# Patient Record
Sex: Female | Born: 1990 | Race: White | Hispanic: No | Marital: Single | Smoking: Current every day smoker
Health system: Southern US, Community
[De-identification: ages and names within clinical notes are randomized; demographics above are authoritative.]

---
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
## 2010-03-01 IMAGING — CR DG ANKLE COMPLETE 3+V*R*
2 series · 2 of 2 positions shown · non-contrast
Comparison: None available

CLINICAL DATA: Twisting injury

RIGHT ANKLE - COMPLETE 3+ VIEW

[view not recorded (1 of 2)]
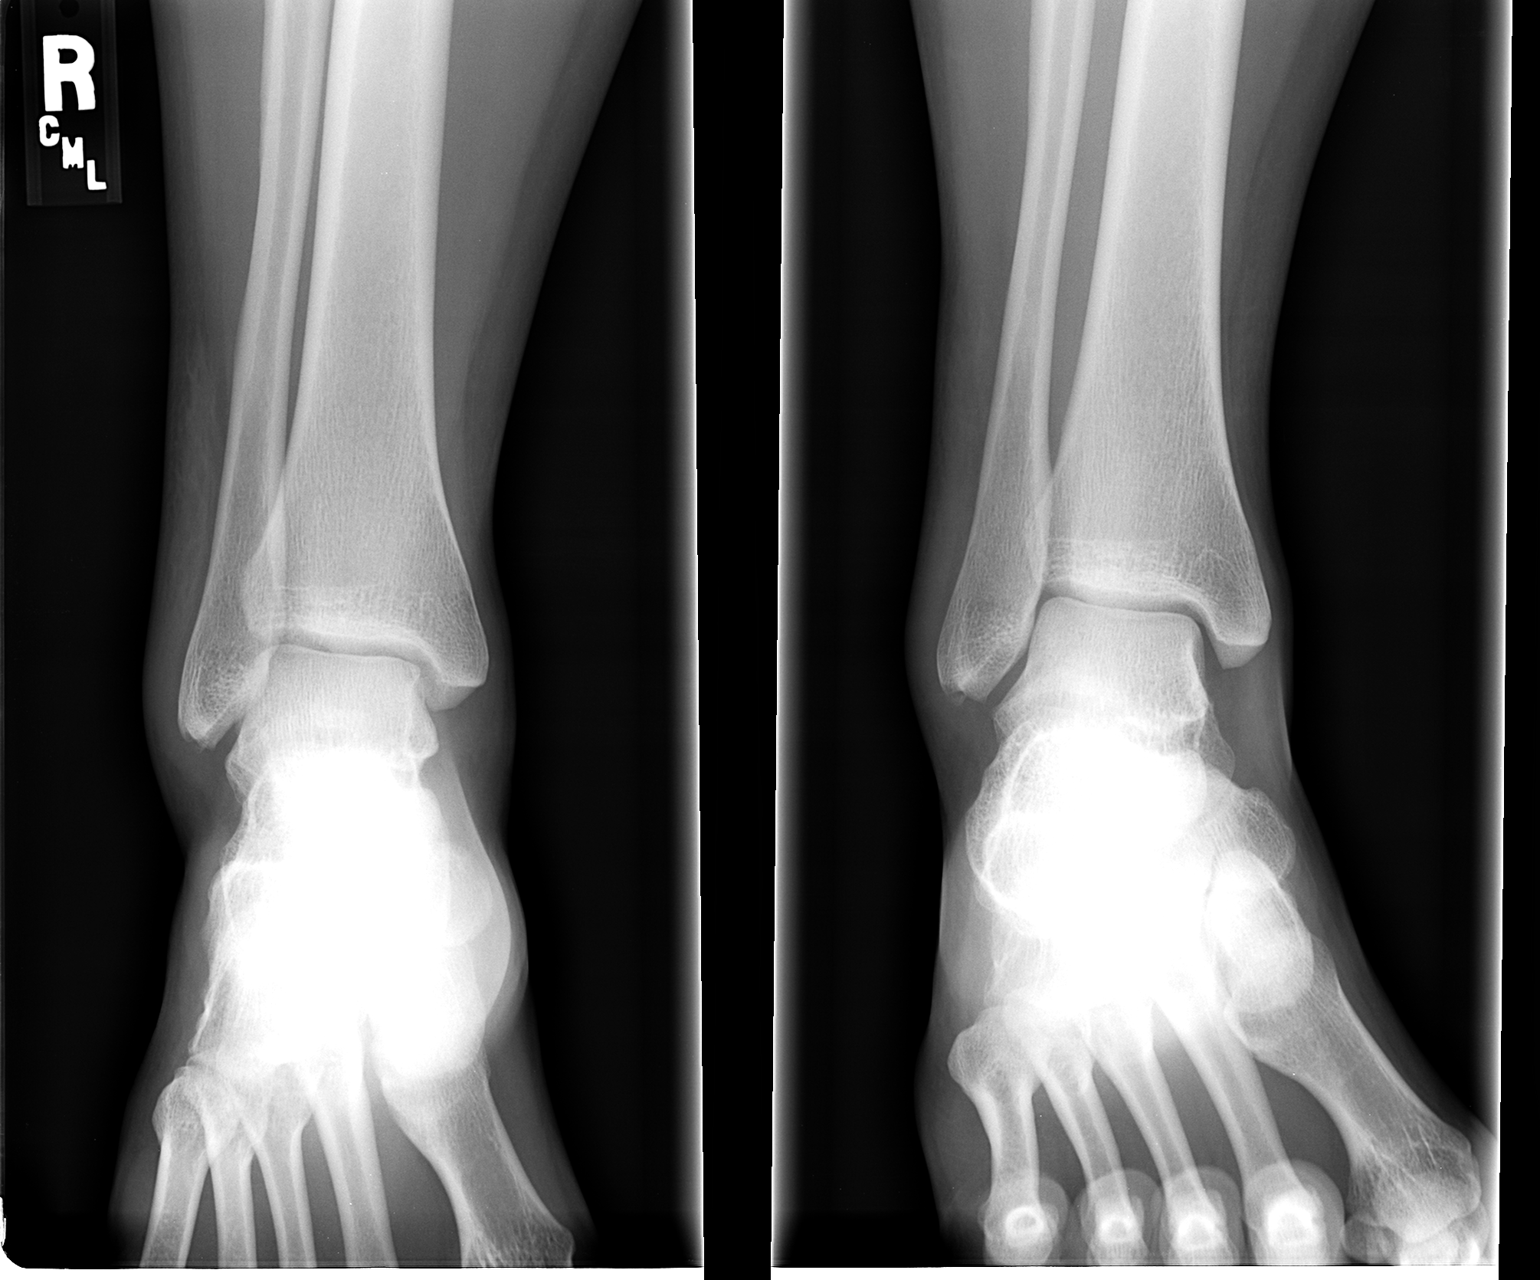

[view not recorded (2 of 2)]
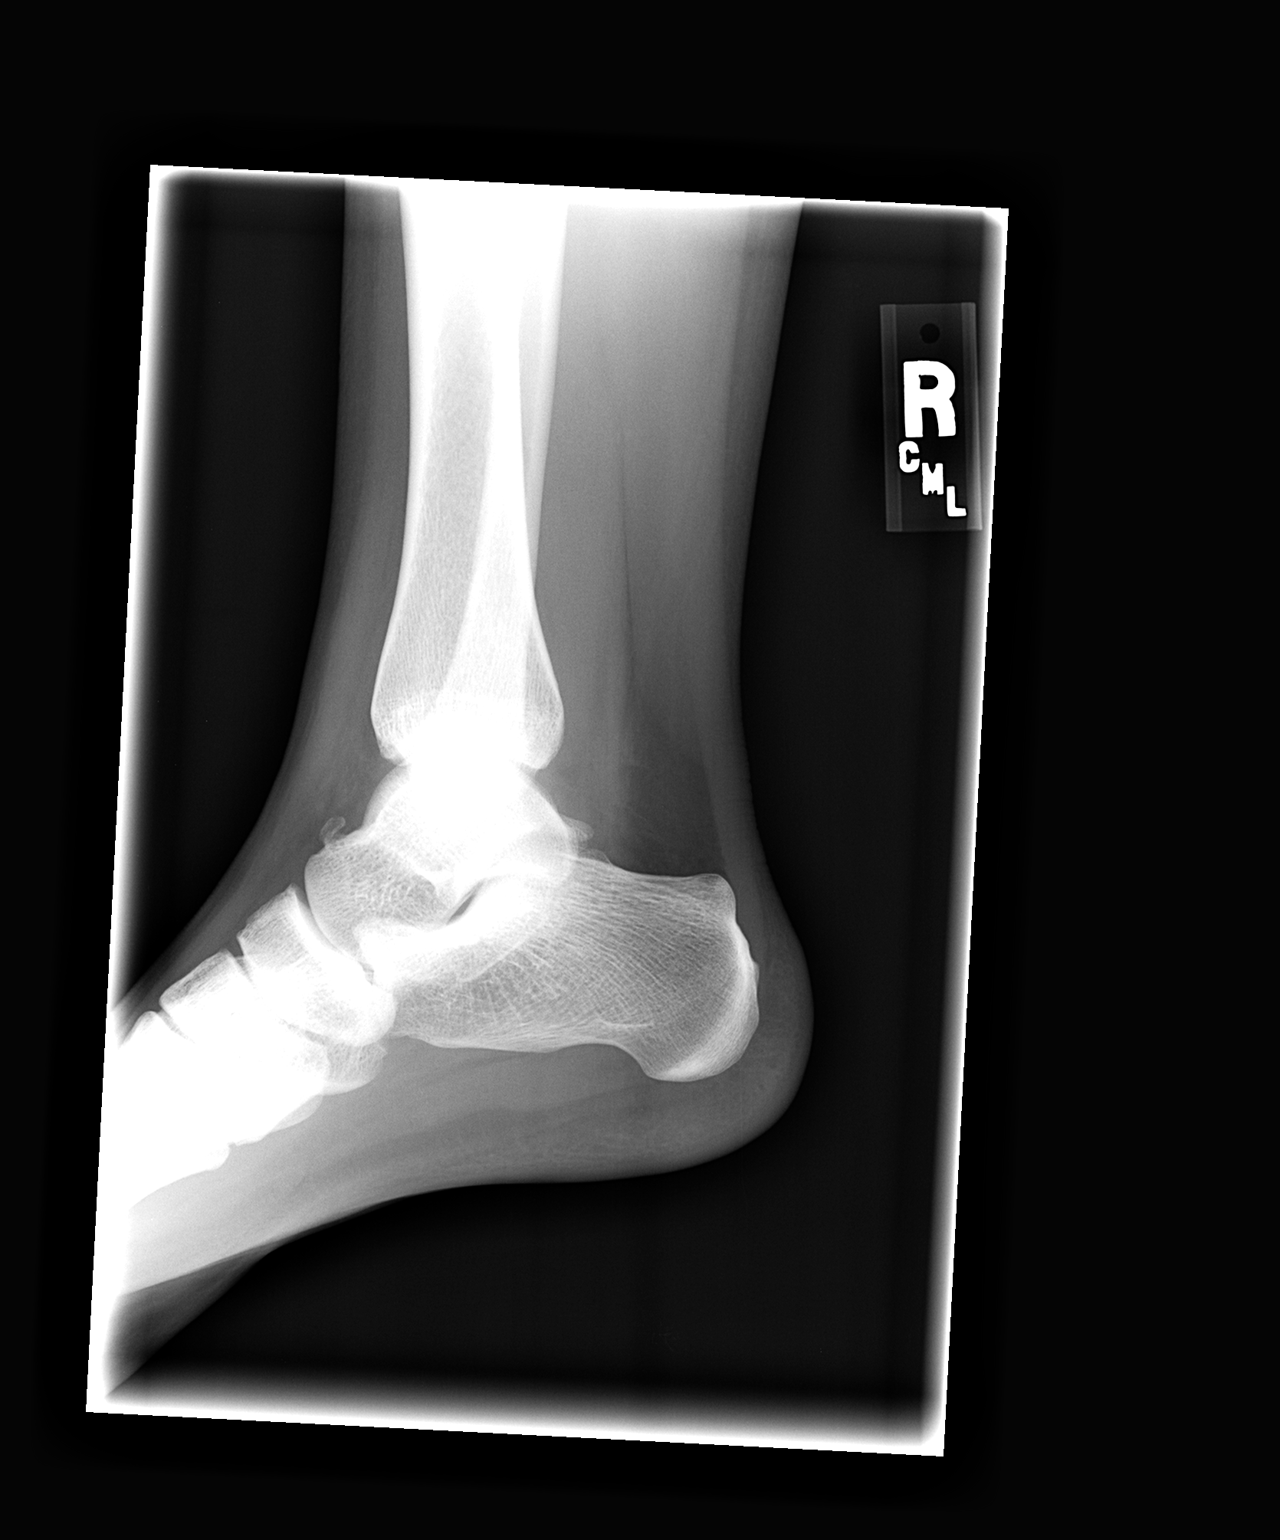

[2 of 2 positions shown; findings below may reference images not displayed]

FINDINGS: 3 mm avulsion fragment projects just inferior to the
lateral malleolus.  There is overlying soft tissue swelling.
Corticated ossicle is noted dorsal to the distal talus.  Ankle
mortise is intact.
IMPRESSION: 1.  Small avulsion fracture from the lateral malleolus.

## 2014-02-22 ENCOUNTER — Telehealth: Payer: Self-pay | Admitting: Cardiology

## 2014-02-22 NOTE — Telephone Encounter (Signed)
[facility] scheduled for appointment 10.30.15 with Dr Antoine PocheHochrein  lp

## 2014-03-16 ENCOUNTER — Ambulatory Visit: Payer: Self-pay | Admitting: Cardiology

## 2014-04-19 ENCOUNTER — Encounter: Payer: Self-pay | Admitting: Cardiology

## 2014-04-30 ENCOUNTER — Encounter: Payer: Self-pay | Admitting: Cardiology

## 2014-09-06 ENCOUNTER — Ambulatory Visit: Payer: Self-pay | Admitting: Women's Health

## 2016-06-22 ENCOUNTER — Emergency Department (HOSPITAL_COMMUNITY)
Admission: EM | Admit: 2016-06-22 | Discharge: 2016-06-22 | Disposition: A | Discharge status: Home / Self Care | Payer: Self-pay | Attending: Emergency Medicine | Admitting: Emergency Medicine

## 2016-06-22 ENCOUNTER — Encounter (HOSPITAL_COMMUNITY): Payer: Self-pay | Admitting: Emergency Medicine

## 2016-06-22 DIAGNOSIS — R05 Cough: Secondary | ICD-10-CM

## 2016-06-22 DIAGNOSIS — R69 Illness, unspecified: Secondary | ICD-10-CM

## 2016-06-22 DIAGNOSIS — Z720 Tobacco use: Secondary | ICD-10-CM

## 2016-06-22 DIAGNOSIS — F17210 Nicotine dependence, cigarettes, uncomplicated: Secondary | ICD-10-CM | POA: Insufficient documentation

## 2016-06-22 DIAGNOSIS — R059 Cough, unspecified: Secondary | ICD-10-CM

## 2016-06-22 DIAGNOSIS — J111 Influenza due to unidentified influenza virus with other respiratory manifestations: Secondary | ICD-10-CM | POA: Insufficient documentation

## 2016-06-22 MED ORDER — OSELTAMIVIR PHOSPHATE 75 MG PO CAPS: 75.0000 mg | ORAL_CAPSULE | Freq: Two times a day (BID) | ORAL | 0 refills | Status: AC

## 2016-06-22 NOTE — ED Triage Notes (Signed)
Pt complaint of flu like symptoms onset yesterday; associated chills, cough, generalizes aches. Pt verbalizes n/v/d on occasion but still able to tolerate fluids/foods.

## 2016-06-22 NOTE — ED Provider Notes (Signed)
[facility]-EMERGENCY DEPT Provider Note   CSN: [id number] Arrival date & time: 06/22/16  1535  By signing my name below, I, Doreatha MartinEva Mathews, attest that this documentation has been prepared under the direction and in the presence of  [address]Winefred Hillesheim, [employer]-C. Electronically Signed: Doreatha MartinEva Mathews, ED Scribe. 06/22/16. 6:24 PM.    History   Chief Complaint Chief Complaint  Patient presents with  . Flu Like Symptoms    HPI Kristin Becker is a 26 y.o. female otherwise healthy who presents to the Emergency Department complaining of flu-like symptoms and moderate, gradually worsening generalized myalgias that began yesterday at 3PM. Pt reports associated chills, HA, sore throat, productive cough with yellow phlegm, and rhinorrhea with clear drainage. Pt also reports occasional blood tinged sputum with coughing, but denies gross hemoptysis. She states she has taken Mucinex and Emergen-C with mild relief, and there are no worsening factors noted. Pt reports possible recent sick contact d/t visiting a hospital and a prison over the weekend. Pt is a current smoker. No h/o asthma or COPD to her knowledge. She denies hemoptysis, ear pain/drainage, drooling, trismus, difficulty swallowing, wheezing, fevers, CP, SOB, abd pain, N/V/D/C, hematuria, dysuria, myalgias, arthralgias, numbness, tingling, focal weakness, rashes, or any other additional complaints.   The history is provided by the patient and medical records. No language interpreter was used.  Influenza  Presenting symptoms: cough, headache, myalgias, rhinorrhea and sore throat   Presenting symptoms: no diarrhea, no fever, no nausea, no shortness of breath and no vomiting   Severity:  Moderate Onset quality:  Gradual Duration:  1 day Progression:  Worsening Chronicity:  New Relieved by:  OTC medications and decongestant (emergen-c and mucinex) Worsened by:  Nothing Ineffective treatments:  None tried Associated symptoms: chills   Associated symptoms: no ear  pain   Risk factors: sick contacts   Risk factors: no immunocompromised state     History reviewed. No pertinent past medical history.  There are no active problems to display for this patient.   History reviewed. No pertinent surgical history.  OB History    No data available       Home Medications    Prior to Admission medications   Not on File    Family History No family history on file.  Social History Social History  Substance Use Topics  . Smoking status: Current Every Day Smoker    Packs/day: 0.50    Types: Cigarettes  . Smokeless tobacco: Not on file  . Alcohol use No     Allergies   Sulfa antibiotics   Review of Systems Review of Systems  Constitutional: Positive for chills. Negative for fever.  HENT: Positive for rhinorrhea and sore throat. Negative for drooling, ear discharge, ear pain and trouble swallowing.   Respiratory: Positive for cough. Negative for shortness of breath and wheezing.        No hemoptysis  Cardiovascular: Negative for chest pain.  Gastrointestinal: Negative for abdominal pain, constipation, diarrhea, nausea and vomiting.  Genitourinary: Negative for dysuria and hematuria.  Musculoskeletal: Positive for myalgias. Negative for arthralgias.  Skin: Negative for color change and rash.  Allergic/Immunologic: Negative for immunocompromised state.  Neurological: Positive for headaches. Negative for weakness and numbness.  Psychiatric/Behavioral: Negative for confusion.    A complete 10 system review of systems was obtained and all systems are negative except as noted in the HPI and PMH.    Physical Exam Updated Vital Signs BP 127/95 (BP Location: Left Arm)   Pulse 91   Temp  98.9 F (37.2 C) (Oral)   Resp 16   Wt 131 lb (59.4 kg)   LMP 06/18/2016   SpO2 100%   Physical Exam  Constitutional: She is oriented to person, place, and time. Vital signs are normal. She appears well-developed and well-nourished.  Non-toxic  appearance. No distress.  Afebrile, nontoxic, NAD  HENT:  Head: Normocephalic and atraumatic.  Right Ear: Hearing, tympanic membrane, external ear and ear canal normal.  Left Ear: Hearing, tympanic membrane, external ear and ear canal normal.  Nose: Mucosal edema and rhinorrhea present.  Mouth/Throat: Uvula is midline and mucous membranes are normal. No trismus in the jaw. No uvula swelling. Posterior oropharyngeal erythema present. No oropharyngeal exudate, posterior oropharyngeal edema or tonsillar abscesses. Tonsils are 0 on the right. Tonsils are 0 on the left. No tonsillar exudate.  Ears are clear bilaterally. Nose with mild mucosal edema and rhinorrhea. Oropharynx injected, without uvular swelling or deviation, no trismus or drooling, no tonsillar swelling, no exudates.  No PTA.   Eyes: Conjunctivae and EOM are normal. Right eye exhibits no discharge. Left eye exhibits no discharge.  Neck: Normal range of motion. Neck supple.  Cardiovascular: Normal rate, regular rhythm, normal heart sounds and intact distal pulses.  Exam reveals no gallop and no friction rub.   No murmur heard. Pulmonary/Chest: Effort normal and breath sounds normal. No respiratory distress. She has no decreased breath sounds. She has no wheezes. She has no rhonchi. She has no rales.  CTAB in all lung fields, no w/r/r, no hypoxia or increased WOB, speaking in full sentences, SpO2 100% on RA   Abdominal: Soft. Normal appearance and bowel sounds are normal. She exhibits no distension. There is no tenderness. There is no rigidity, no rebound, no guarding, no CVA tenderness, no tenderness at McBurney's point and negative Murphy's sign.  Musculoskeletal: Normal range of motion.  Lymphadenopathy:    She has cervical adenopathy.  Shotty cervical LAD bilaterally.   Neurological: She is alert and oriented to person, place, and time. She has normal strength. No sensory deficit.  Skin: Skin is warm, dry and intact. No rash noted.    Psychiatric: She has a normal mood and affect.  Nursing note and vitals reviewed.    ED Treatments / Results   DIAGNOSTIC STUDIES: Oxygen Saturation is 100% on RA, normal by my interpretation.    COORDINATION OF CARE: 6:02 PM Discussed treatment plan with pt at bedside which includes symptomatic therapy and pt agreed to plan.    Labs (all labs ordered are listed, but only abnormal results are displayed) Labs Reviewed - No data to display  EKG  EKG Interpretation None       Radiology No results found.  Procedures Procedures (including critical care time)  Medications Ordered in ED Medications - No data to display   Initial Impression / Assessment and Plan / ED Course  I have reviewed the triage vital signs and the nursing notes.  Pertinent labs & imaging results that were available during my care of the patient were reviewed by me and considered in my medical decision making (see chart for details).     27 y.o. female here with flu like symptoms x1 day. Pt is afebrile with a clear lung exam. Mild rhinorrhea. Mildly injected throat but otherwise clear. Likely viral/flu URI; will proceed with empiric tamiflu given that she's in the window of potential benefit. Smoking cessation advised. Pt is agreeable to other OTC meds for symptomatic treatment with close follow up with  PCP as needed but spoke at length about emergent changing or worsening of symptoms that should prompt return to ER. Pt voices understanding and is agreeable to plan. Stable at time of discharge.   I personally performed the services described in this documentation, which was scribed in my presence. The recorded information has been reviewed and is accurate.   Final Clinical Impressions(s) / ED Diagnoses   Final diagnoses:  Influenza-like illness  Cough  Tobacco user    New Prescriptions New Prescriptions   OSELTAMIVIR (TAMIFLU) 75 MG CAPSULE    Take 1 capsule (75 mg total) by mouth every 12  (twelve) hours.     [address], PA-C 06/22/16 1835    Canary Brim Tegeler, MD 06/23/16 [phone]

## 2016-06-22 NOTE — Discharge Instructions (Signed)
Continue to stay well-hydrated. Gargle warm salt water and spit it out. Use chloraseptic spray as needed for sore throat. Continue to alternate between Tylenol and Ibuprofen for pain or fever. Use Mucinex for cough suppression/expectoration of mucus. Use netipot and flonase to help with nasal congestion. May consider over-the-counter Benadryl or other antihistamine to decrease secretions and for help with your symptoms. Take tamiflu as directed which will help decrease the severity and duration of symptoms if your illness is due to the flu; however, if you don't have the flu and it's just another viral illness, it will get better with time but tamiflu will not have much effect on it. TAMIFLU DOES NOT CURE THE FLU! Take on a full stomach as it can cause some nausea. STOP SMOKING!  Follow up with your primary care doctor in 5-7 days for recheck of ongoing symptoms. Return to emergency department for emergent changing or worsening of symptoms.

## 2017-08-26 DIAGNOSIS — Z3040 Encounter for surveillance of contraceptives, unspecified: Secondary | ICD-10-CM | POA: Diagnosis not present

## 2017-08-26 DIAGNOSIS — Z01419 Encounter for gynecological examination (general) (routine) without abnormal findings: Secondary | ICD-10-CM | POA: Diagnosis not present

## 2017-08-26 DIAGNOSIS — Z124 Encounter for screening for malignant neoplasm of cervix: Secondary | ICD-10-CM | POA: Diagnosis not present

## 2017-08-26 DIAGNOSIS — Z6824 Body mass index (BMI) 24.0-24.9, adult: Secondary | ICD-10-CM | POA: Diagnosis not present

## 2017-08-26 DIAGNOSIS — Z23 Encounter for immunization: Secondary | ICD-10-CM | POA: Diagnosis not present

## 2018-11-03 DIAGNOSIS — Z6824 Body mass index (BMI) 24.0-24.9, adult: Secondary | ICD-10-CM | POA: Diagnosis not present

## 2018-11-03 DIAGNOSIS — Z124 Encounter for screening for malignant neoplasm of cervix: Secondary | ICD-10-CM | POA: Diagnosis not present

## 2018-11-03 DIAGNOSIS — R87610 Atypical squamous cells of undetermined significance on cytologic smear of cervix (ASC-US): Secondary | ICD-10-CM | POA: Diagnosis not present

## 2018-11-03 DIAGNOSIS — Z3040 Encounter for surveillance of contraceptives, unspecified: Secondary | ICD-10-CM | POA: Diagnosis not present

## 2018-11-03 DIAGNOSIS — N898 Other specified noninflammatory disorders of vagina: Secondary | ICD-10-CM | POA: Diagnosis not present

## 2018-11-03 DIAGNOSIS — Z01419 Encounter for gynecological examination (general) (routine) without abnormal findings: Secondary | ICD-10-CM | POA: Diagnosis not present

## 2018-11-22 DIAGNOSIS — Z20828 Contact with and (suspected) exposure to other viral communicable diseases: Secondary | ICD-10-CM | POA: Diagnosis not present

## 2018-12-07 DIAGNOSIS — Z0184 Encounter for antibody response examination: Secondary | ICD-10-CM | POA: Diagnosis not present

## 2018-12-07 DIAGNOSIS — Z23 Encounter for immunization: Secondary | ICD-10-CM | POA: Diagnosis not present

## 2018-12-08 DIAGNOSIS — Z029 Encounter for administrative examinations, unspecified: Secondary | ICD-10-CM | POA: Diagnosis not present

## 2018-12-08 DIAGNOSIS — Z0184 Encounter for antibody response examination: Secondary | ICD-10-CM | POA: Diagnosis not present

## 2018-12-12 DIAGNOSIS — Z23 Encounter for immunization: Secondary | ICD-10-CM | POA: Diagnosis not present

## 2018-12-22 DIAGNOSIS — Z23 Encounter for immunization: Secondary | ICD-10-CM | POA: Diagnosis not present

## 2019-01-10 DIAGNOSIS — Z23 Encounter for immunization: Secondary | ICD-10-CM | POA: Diagnosis not present

## 2019-02-28 DIAGNOSIS — Z23 Encounter for immunization: Secondary | ICD-10-CM | POA: Diagnosis not present

## 2019-05-06 DIAGNOSIS — Z20828 Contact with and (suspected) exposure to other viral communicable diseases: Secondary | ICD-10-CM | POA: Diagnosis not present

## 2019-05-06 DIAGNOSIS — Z03818 Encounter for observation for suspected exposure to other biological agents ruled out: Secondary | ICD-10-CM | POA: Diagnosis not present

## 2020-11-06 DIAGNOSIS — Z6828 Body mass index (BMI) 28.0-28.9, adult: Secondary | ICD-10-CM | POA: Diagnosis not present

## 2020-11-06 DIAGNOSIS — Z3040 Encounter for surveillance of contraceptives, unspecified: Secondary | ICD-10-CM | POA: Diagnosis not present

## 2020-11-06 DIAGNOSIS — Z01419 Encounter for gynecological examination (general) (routine) without abnormal findings: Secondary | ICD-10-CM | POA: Diagnosis not present

## 2021-12-05 DIAGNOSIS — Z124 Encounter for screening for malignant neoplasm of cervix: Secondary | ICD-10-CM | POA: Diagnosis not present

## 2021-12-05 DIAGNOSIS — R87810 Cervical high risk human papillomavirus (HPV) DNA test positive: Secondary | ICD-10-CM | POA: Diagnosis not present

## 2021-12-05 DIAGNOSIS — Z113 Encounter for screening for infections with a predominantly sexual mode of transmission: Secondary | ICD-10-CM | POA: Diagnosis not present

## 2021-12-05 DIAGNOSIS — Z01411 Encounter for gynecological examination (general) (routine) with abnormal findings: Secondary | ICD-10-CM | POA: Diagnosis not present

## 2021-12-05 DIAGNOSIS — N898 Other specified noninflammatory disorders of vagina: Secondary | ICD-10-CM | POA: Diagnosis not present

## 2021-12-05 DIAGNOSIS — F3281 Premenstrual dysphoric disorder: Secondary | ICD-10-CM | POA: Diagnosis not present

## 2021-12-05 DIAGNOSIS — Z01419 Encounter for gynecological examination (general) (routine) without abnormal findings: Secondary | ICD-10-CM | POA: Diagnosis not present

## 2022-01-02 DIAGNOSIS — R87810 Cervical high risk human papillomavirus (HPV) DNA test positive: Secondary | ICD-10-CM | POA: Diagnosis not present

## 2022-01-02 DIAGNOSIS — R87611 Atypical squamous cells cannot exclude high grade squamous intraepithelial lesion on cytologic smear of cervix (ASC-H): Secondary | ICD-10-CM | POA: Diagnosis not present

## 2022-01-02 DIAGNOSIS — N871 Moderate cervical dysplasia: Secondary | ICD-10-CM | POA: Diagnosis not present

## 2022-01-13 DIAGNOSIS — N871 Moderate cervical dysplasia: Secondary | ICD-10-CM | POA: Diagnosis not present

## 2022-01-13 DIAGNOSIS — R87613 High grade squamous intraepithelial lesion on cytologic smear of cervix (HGSIL): Secondary | ICD-10-CM | POA: Diagnosis not present

## 2022-01-13 DIAGNOSIS — D069 Carcinoma in situ of cervix, unspecified: Secondary | ICD-10-CM | POA: Diagnosis not present

## 2022-02-13 ENCOUNTER — Other Ambulatory Visit (HOSPITAL_COMMUNITY): Payer: Self-pay

## 2022-02-13 MED ORDER — VALACYCLOVIR HCL 500 MG PO TABS
500.0000 mg | ORAL_TABLET | Freq: Every day | ORAL | 4 refills | Status: DC
  Filled 2022-02-13: qty 90, 90d supply, fill #0

## 2022-03-13 DIAGNOSIS — D069 Carcinoma in situ of cervix, unspecified: Secondary | ICD-10-CM | POA: Diagnosis not present

## 2022-03-13 DIAGNOSIS — R87810 Cervical high risk human papillomavirus (HPV) DNA test positive: Secondary | ICD-10-CM | POA: Diagnosis not present

## 2022-03-13 DIAGNOSIS — R87613 High grade squamous intraepithelial lesion on cytologic smear of cervix (HGSIL): Secondary | ICD-10-CM | POA: Diagnosis not present

## 2022-04-17 DIAGNOSIS — D069 Carcinoma in situ of cervix, unspecified: Secondary | ICD-10-CM | POA: Diagnosis not present

## 2022-04-17 DIAGNOSIS — Z3202 Encounter for pregnancy test, result negative: Secondary | ICD-10-CM | POA: Diagnosis not present

## 2022-05-01 DIAGNOSIS — Z09 Encounter for follow-up examination after completed treatment for conditions other than malignant neoplasm: Secondary | ICD-10-CM | POA: Diagnosis not present

## 2022-05-01 DIAGNOSIS — N898 Other specified noninflammatory disorders of vagina: Secondary | ICD-10-CM | POA: Diagnosis not present

## 2022-05-01 DIAGNOSIS — N760 Acute vaginitis: Secondary | ICD-10-CM | POA: Diagnosis not present

## 2022-06-05 DIAGNOSIS — D069 Carcinoma in situ of cervix, unspecified: Secondary | ICD-10-CM | POA: Diagnosis not present

## 2022-06-05 DIAGNOSIS — Z09 Encounter for follow-up examination after completed treatment for conditions other than malignant neoplasm: Secondary | ICD-10-CM | POA: Diagnosis not present

## 2022-09-29 DIAGNOSIS — D485 Neoplasm of uncertain behavior of skin: Secondary | ICD-10-CM | POA: Diagnosis not present

## 2022-10-23 DIAGNOSIS — G479 Sleep disorder, unspecified: Secondary | ICD-10-CM | POA: Diagnosis not present

## 2022-10-23 DIAGNOSIS — Z131 Encounter for screening for diabetes mellitus: Secondary | ICD-10-CM | POA: Diagnosis not present

## 2022-10-23 DIAGNOSIS — R635 Abnormal weight gain: Secondary | ICD-10-CM | POA: Diagnosis not present

## 2022-10-23 DIAGNOSIS — R6889 Other general symptoms and signs: Secondary | ICD-10-CM | POA: Diagnosis not present

## 2022-11-27 DIAGNOSIS — R748 Abnormal levels of other serum enzymes: Secondary | ICD-10-CM | POA: Diagnosis not present

## 2022-12-03 ENCOUNTER — Other Ambulatory Visit (HOSPITAL_BASED_OUTPATIENT_CLINIC_OR_DEPARTMENT_OTHER): Payer: Self-pay | Admitting: Family Medicine

## 2022-12-03 DIAGNOSIS — R748 Abnormal levels of other serum enzymes: Secondary | ICD-10-CM

## 2022-12-12 ENCOUNTER — Ambulatory Visit (HOSPITAL_BASED_OUTPATIENT_CLINIC_OR_DEPARTMENT_OTHER)
Admission: RE | Admit: 2022-12-12 | Discharge: 2022-12-12 | Disposition: A | Discharge status: Home / Self Care | Payer: 59 | Source: Ambulatory Visit | Attending: Family Medicine | Admitting: Family Medicine

## 2022-12-12 DIAGNOSIS — R748 Abnormal levels of other serum enzymes: Secondary | ICD-10-CM | POA: Diagnosis not present

## 2022-12-12 DIAGNOSIS — K760 Fatty (change of) liver, not elsewhere classified: Secondary | ICD-10-CM | POA: Diagnosis not present

## 2022-12-25 DIAGNOSIS — Z3041 Encounter for surveillance of contraceptive pills: Secondary | ICD-10-CM | POA: Diagnosis not present

## 2022-12-25 DIAGNOSIS — Z01419 Encounter for gynecological examination (general) (routine) without abnormal findings: Secondary | ICD-10-CM | POA: Diagnosis not present

## 2022-12-25 DIAGNOSIS — R87611 Atypical squamous cells cannot exclude high grade squamous intraepithelial lesion on cytologic smear of cervix (ASC-H): Secondary | ICD-10-CM | POA: Diagnosis not present

## 2022-12-25 DIAGNOSIS — Z3040 Encounter for surveillance of contraceptives, unspecified: Secondary | ICD-10-CM | POA: Diagnosis not present

## 2023-12-10 DIAGNOSIS — G479 Sleep disorder, unspecified: Secondary | ICD-10-CM | POA: Diagnosis not present

## 2023-12-10 DIAGNOSIS — Z6831 Body mass index (BMI) 31.0-31.9, adult: Secondary | ICD-10-CM | POA: Diagnosis not present

## 2024-02-11 DIAGNOSIS — Z87410 Personal history of cervical dysplasia: Secondary | ICD-10-CM | POA: Diagnosis not present

## 2024-02-11 DIAGNOSIS — Z01411 Encounter for gynecological examination (general) (routine) with abnormal findings: Secondary | ICD-10-CM | POA: Diagnosis not present

## 2024-02-11 DIAGNOSIS — Z3041 Encounter for surveillance of contraceptive pills: Secondary | ICD-10-CM | POA: Diagnosis not present

## 2024-02-12 ENCOUNTER — Other Ambulatory Visit (HOSPITAL_COMMUNITY): Payer: Self-pay

## 2024-02-12 MED ORDER — NORETHINDRON-ETHINYL ESTRAD-FE 1-20/1-30/1-35 MG-MCG PO TABS
1.0000 | ORAL_TABLET | Freq: Every day | ORAL | 3 refills | Status: AC
  Filled 2024-02-12 – 2024-05-15 (×6): qty 84, 84d supply, fill #0

## 2024-02-22 ENCOUNTER — Other Ambulatory Visit (HOSPITAL_COMMUNITY): Payer: Self-pay

## 2024-03-01 ENCOUNTER — Other Ambulatory Visit (HOSPITAL_COMMUNITY): Payer: Self-pay

## 2024-03-01 MED ORDER — VALACYCLOVIR HCL 500 MG PO TABS
500.0000 mg | ORAL_TABLET | Freq: Every day | ORAL | 4 refills | Status: AC
  Filled 2024-03-01 – 2024-03-12 (×2): qty 90, 90d supply, fill #0
  Filled 2024-05-12 – 2024-05-24 (×2): qty 90, 90d supply, fill #1

## 2024-03-12 ENCOUNTER — Other Ambulatory Visit (HOSPITAL_COMMUNITY): Payer: Self-pay

## 2024-03-13 ENCOUNTER — Other Ambulatory Visit: Payer: Self-pay

## 2024-03-14 ENCOUNTER — Other Ambulatory Visit (HOSPITAL_COMMUNITY): Payer: Self-pay

## 2024-05-12 ENCOUNTER — Other Ambulatory Visit (HOSPITAL_COMMUNITY): Payer: Self-pay

## 2024-05-12 ENCOUNTER — Other Ambulatory Visit: Payer: Self-pay

## 2024-05-15 ENCOUNTER — Other Ambulatory Visit (HOSPITAL_COMMUNITY): Payer: Self-pay

## 2024-05-24 ENCOUNTER — Other Ambulatory Visit (HOSPITAL_COMMUNITY): Payer: Self-pay

## 2024-06-03 ENCOUNTER — Other Ambulatory Visit (HOSPITAL_COMMUNITY): Payer: Self-pay
